# Patient Record
Sex: Male | Born: 2000 | Race: White | Hispanic: No | Marital: Single | Smoking: Never smoker
Health system: Southern US, Community
[De-identification: ages and names within clinical notes are randomized; demographics above are authoritative.]

---
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
## 2020-06-11 ENCOUNTER — Other Ambulatory Visit: Payer: Self-pay | Admitting: Family Medicine

## 2020-07-03 ENCOUNTER — Encounter: Payer: Self-pay | Admitting: Urology

## 2020-07-03 ENCOUNTER — Other Ambulatory Visit: Payer: Self-pay

## 2020-07-03 ENCOUNTER — Ambulatory Visit (INDEPENDENT_AMBULATORY_CARE_PROVIDER_SITE_OTHER): Payer: BC Managed Care – PPO | Admitting: Urology

## 2020-07-03 VITALS — BP 110/70 | HR 66 | Ht 71.0 in | Wt 135.0 lb

## 2020-07-03 DIAGNOSIS — R300 Dysuria: Secondary | ICD-10-CM | POA: Diagnosis not present

## 2020-07-03 MED ORDER — CELECOXIB 200 MG PO CAPS: 200.0000 mg | ORAL_CAPSULE | Freq: Two times a day (BID) | ORAL | 0 refills | Status: AC

## 2020-07-03 NOTE — Progress Notes (Signed)
   07/03/20 3:40 PM   Jeffrey Bean Jul 29, 2000 [id number]  CC: Dysuria  HPI: I saw Jeffrey Bean in urology clinic for dysuria.  He reports up to 6 months of intermittent burning with urination.  There are no aggravating or alleviating factors.  He does not smoke, but he vapes.  Multiple STD testing and urinalyses have been benign.  Urinalysis today is benign with 0-5 WBCs, 0 RBCs, no bacteria, nitrate negative, no leukocytes.  He denies any difficulty with urination.  He has also noticed some sediment in the urine when he urinates in a cup and looks at it later.   Social History:  reports that he has never smoked. He has never used smokeless tobacco. He reports current alcohol use. He reports previous drug use.  Physical Exam: BP 110/70 (BP Location: Left Arm, Patient Position: Sitting, Cuff Size: Normal)   Pulse 66   Ht 5\' 11"  (1.803 m)   Wt 135 lb (61.2 kg)   BMI 18.83 kg/m    Constitutional:  Alert and oriented, No acute distress. Cardiovascular: No clubbing, cyanosis, or edema. Respiratory: Normal respiratory effort, no increased work of breathing. GI: Abdomen is soft, nontender, nondistended, no abdominal masses GU: Circumcised phallus with patent meatus, no lesions, testicles 20 cc and descended bilaterally without masses, no penile lesions  Laboratory Data: Reviewed, see HPI   Assessment & Plan:   20 year old male with mild intermittent dysuria of unclear etiology.  We discussed possible causes including urethritis, as well as chemical irritation from vaping.  STD testing and urinalysis have shown no evidence of infection, and exam is benign.  I recommended a trial of Celebrex twice daily for 10 days, and counseled him to cut back on vaping and avoid other bladder irritants.  Follow-up with urology as needed  26, MD 07/03/2020  [facility] [address] [place], [place] [phone]

## 2020-07-03 NOTE — Patient Instructions (Signed)
Avoid vaping for the next few weeks and see if this helps to improve some of your burning with urination.  Avoiding sodas and caffeine can also sometimes help with the burning.  We prescribed a medication called Celebrex that can help decrease any inflammation in the bladder or urethra.  Take this daily for the next 10 days.  Take this with food as it can upset your stomach.  If things or not improving please let us know.

## 2020-07-04 LAB — MICROSCOPIC EXAMINATION
Bacteria, UA: NONE SEEN
Epithelial Cells (non renal): NONE SEEN /hpf (ref 0–10)
RBC, Urine: NONE SEEN /hpf (ref 0–2)

## 2020-07-04 LAB — URINALYSIS, COMPLETE
Bilirubin, UA: NEGATIVE
Glucose, UA: NEGATIVE
Ketones, UA: NEGATIVE
Leukocytes,UA: NEGATIVE
Nitrite, UA: NEGATIVE
Protein,UA: NEGATIVE
RBC, UA: NEGATIVE
Specific Gravity, UA: 1.01 (ref 1.005–1.030)
Urobilinogen, Ur: 0.2 mg/dL (ref 0.2–1.0)
pH, UA: 7 (ref 5.0–7.5)
